# Patient Record
Sex: Female | Born: 1995 | Race: Black or African American | Hispanic: No | Marital: Single | Smoking: Never smoker
Health system: Southern US, Community
[De-identification: ages and names within clinical notes are randomized; demographics above are authoritative.]

## PROBLEM LIST (undated history)

## (undated) DIAGNOSIS — E538 Deficiency of other specified B group vitamins: Secondary | ICD-10-CM

## (undated) DIAGNOSIS — M419 Scoliosis, unspecified: Secondary | ICD-10-CM

## (undated) DIAGNOSIS — E559 Vitamin D deficiency, unspecified: Secondary | ICD-10-CM

## (undated) DIAGNOSIS — M545 Low back pain, unspecified: Secondary | ICD-10-CM

## (undated) HISTORY — DX: Deficiency of other specified B group vitamins: E53.8

## (undated) HISTORY — DX: Vitamin D deficiency, unspecified: E55.9

## (undated) HISTORY — DX: Scoliosis, unspecified: M41.9

## (undated) HISTORY — DX: Low back pain, unspecified: M54.50

## (undated) HISTORY — DX: Low back pain: M54.5

---
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
## 2016-04-01 HISTORY — PX: DILATION AND CURETTAGE, DIAGNOSTIC / THERAPEUTIC: SUR384

## 2016-06-13 ENCOUNTER — Encounter: Payer: Self-pay | Admitting: Emergency Medicine

## 2016-06-13 ENCOUNTER — Emergency Department
Admission: EM | Admit: 2016-06-13 | Discharge: 2016-06-14 | Disposition: A | Discharge status: Home / Self Care | Payer: Self-pay | Attending: Emergency Medicine | Admitting: Emergency Medicine

## 2016-06-13 DIAGNOSIS — L299 Pruritus, unspecified: Secondary | ICD-10-CM | POA: Insufficient documentation

## 2016-06-13 LAB — CBC
HEMATOCRIT: 39.4 % (ref 35.0–47.0)
HEMOGLOBIN: 13.3 g/dL (ref 12.0–16.0)
MCH: 27.9 pg (ref 26.0–34.0)
MCHC: 33.7 g/dL (ref 32.0–36.0)
MCV: 82.9 fL (ref 80.0–100.0)
Platelets: 374 10*3/uL (ref 150–440)
RBC: 4.75 MIL/uL (ref 3.80–5.20)
RDW: 14.3 % (ref 11.5–14.5)
WBC: 11.7 10*3/uL — ABNORMAL HIGH (ref 3.6–11.0)

## 2016-06-13 LAB — URINALYSIS, COMPLETE (UACMP) WITH MICROSCOPIC
BACTERIA UA: NONE SEEN
Bilirubin Urine: NEGATIVE
GLUCOSE, UA: NEGATIVE mg/dL
KETONES UR: NEGATIVE mg/dL
Leukocytes, UA: NEGATIVE
Nitrite: NEGATIVE
PROTEIN: NEGATIVE mg/dL
Specific Gravity, Urine: 1.03 (ref 1.005–1.030)
pH: 6 (ref 5.0–8.0)

## 2016-06-13 LAB — COMPREHENSIVE METABOLIC PANEL
ALK PHOS: 74 U/L (ref 38–126)
ALT: 34 U/L (ref 14–54)
ANION GAP: 6 (ref 5–15)
AST: 35 U/L (ref 15–41)
Albumin: 4.3 g/dL (ref 3.5–5.0)
BUN: 14 mg/dL (ref 6–20)
CALCIUM: 9.1 mg/dL (ref 8.9–10.3)
CO2: 25 mmol/L (ref 22–32)
Chloride: 107 mmol/L (ref 101–111)
Creatinine, Ser: 0.82 mg/dL (ref 0.44–1.00)
GFR calc non Af Amer: >60 mL/min (ref >60)
Glucose, Bld: 95 mg/dL (ref 65–99)
Potassium: 4.1 mmol/L (ref 3.5–5.1)
SODIUM: 138 mmol/L (ref 135–145)
TOTAL PROTEIN: 8.5 g/dL — AB (ref 6.5–8.1)
Total Bilirubin: 0.7 mg/dL (ref 0.3–1.2)

## 2016-06-13 LAB — LIPASE, BLOOD: Lipase: 23 U/L (ref 11–51)

## 2016-06-13 NOTE — ED Triage Notes (Signed)
Pt ambulatory to triage in NAD, report itching x 2 days, no rash seen, started on back and spread all over body.  Pt reports vomiting this morning and nausea since.  Pt c/o HA as well.

## 2016-06-13 NOTE — ED Notes (Addendum)
Pt reports itching x2 days, started on her face and turned "gray and green", before spreading to rest of body. No rash or discoloration noted to face or body at this time. Pt reports use of new "coconut soap" and OTC Prenatal vitamins (pt states unsure of actual pregnancy, "taking them just in case").

## 2016-06-14 LAB — POCT PREGNANCY, URINE
Preg Test, Ur: NEGATIVE
Preg Test, Ur: NEGATIVE

## 2016-06-14 MED ORDER — LORATADINE 10 MG PO TABS
10.0000 mg | ORAL_TABLET | Freq: Once | ORAL | Status: AC
  Administered 2016-06-14: 10 mg via ORAL
  Filled 2016-06-14: qty 1

## 2016-06-14 NOTE — ED Provider Notes (Signed)
[facility] Emergency Department Provider Note   First MD Initiated Contact with Patient 06/13/16 2350     (approximate)  I have reviewed the triage vital signs and the nursing notes.   HISTORY  Chief Complaint Pruritis; Emesis; and Headache   HPI Dominique Beck is a 21 y.o. female presents with generalized itching intermittently 2 days. Patient denies any rash area patient states pruritus started on her back and subsequently spread all over her body. Patient denies any dyspnea or difficulty swallowing. Patient states that she has not started any new education, did not eat any unusual foods, she does admit to starting a new Coke and an body scrub. Patient admits to nausea and states that she believes she may be pregnant. Patient states she's had  multiple negative pregnancy test at home. Last LMP February 2018   Past medical history No pertinent past medical history There are no active problems to display for this patient.   Past surgical history None  Prior to Admission medications   Not on File    Allergies No known drug allergies History reviewed. No pertinent family history.  Social History Social History  Substance Use Topics  . Smoking status: Never Smoker  . Smokeless tobacco: Never Used  . Alcohol use Yes    Review of Systems Constitutional: No fever/chills Eyes: No visual changes. ENT: No sore throat. Cardiovascular: Denies chest pain. Respiratory: Denies shortness of breath. Gastrointestinal: No abdominal pain.  No nausea, no vomiting.  No diarrhea.  No constipation. Genitourinary: Negative for dysuria. Musculoskeletal: Negative for back pain. Skin: Negative for rash.Positive for pruritus  Neurological: Negative for headaches, focal weakness or numbness.  10-point ROS otherwise negative.  ____________________________________________   PHYSICAL EXAM:  VITAL SIGNS: ED Triage Vitals [06/13/16 2241]  Enc Vitals Group   BP 130/79     Pulse Rate 90     Resp 18     Temp 99.3 F (37.4 C)     Temp Source Oral     SpO2 99 %     Weight 180 lb (81.6 kg)     Height 5\' 4"  (1.626 m)     Head Circumference      Peak Flow      Pain Score 0     Pain Loc      Pain Edu?      Excl. in GC?     Constitutional: Alert and oriented. Well appearing and in no acute distress. Eyes: Conjunctivae are normal. PERRL. EOMI. Head: Atraumatic. Mouth/Throat: Mucous membranes are moist.  Oropharynx non-erythematous. Neck: No stridor.  Cardiovascular: Normal rate, regular rhythm. Good peripheral circulation. Grossly normal heart sounds. Respiratory: Normal respiratory effort.  No retractions. Lungs CTAB. Gastrointestinal: Soft and nontender. No distention.  Musculoskeletal: No lower extremity tenderness nor edema. No gross deformities of extremities. Neurologic:  Normal speech and language. No gross focal neurologic deficits are appreciated.  Skin:  Skin is warm, dry and intact. No rash noted. Psychiatric: Mood and affect are normal. Speech and behavior are normal.  ____________________________________________   LABS (all labs ordered are listed, but only abnormal results are displayed)  Labs Reviewed  COMPREHENSIVE METABOLIC PANEL - Abnormal; Notable for the following:       Result Value   Total Protein 8.5 (*)    All other components within normal limits  CBC - Abnormal; Notable for the following:    WBC 11.7 (*)    All other components within normal limits  URINALYSIS, COMPLETE (UACMP) WITH  MICROSCOPIC - Abnormal; Notable for the following:    Color, Urine YELLOW (*)    APPearance CLEAR (*)    Hgb urine dipstick SMALL (*)    Squamous Epithelial / LPF 0-5 (*)    All other components within normal limits  LIPASE, BLOOD  POC URINE PREG, ED  POCT PREGNANCY, URINE     Procedures     INITIAL IMPRESSION / ASSESSMENT AND PLAN / ED COURSE  Pertinent labs & imaging results that were available during my care of  the patient were reviewed by me and considered in my medical decision making (see chart for detail   History and physical exam consistent with possible contact dermatitis. Patient will be referred to primary care provider for further outpatient evaluation.  ____________________________________________  FINAL CLINICAL IMPRESSION(S) / ED DIAGNOSES  Final diagnoses:  Pruritus     MEDICATIONS GIVEN DURING THIS VISIT:  Medications  loratadine (CLARITIN) tablet 10 mg (not administered)     NEW OUTPATIENT MEDICATIONS STARTED DURING THIS VISIT:  New Prescriptions   No medications on file    Modified Medications   No medications on file    Discontinued Medications   No medications on file     Note:  This document was prepared using Dragon voice recognition software and may include unintentional dictation errors.    Darci Current, MD 06/14/16 [phone]

## 2016-11-12 ENCOUNTER — Emergency Department (HOSPITAL_COMMUNITY)
Admission: EM | Admit: 2016-11-12 | Discharge: 2016-11-12 | Disposition: A | Discharge status: Home / Self Care | Payer: Self-pay | Attending: Emergency Medicine | Admitting: Emergency Medicine

## 2016-11-12 ENCOUNTER — Encounter (HOSPITAL_COMMUNITY): Payer: Self-pay | Admitting: Emergency Medicine

## 2016-11-12 DIAGNOSIS — Z872 Personal history of diseases of the skin and subcutaneous tissue: Secondary | ICD-10-CM | POA: Insufficient documentation

## 2016-11-12 DIAGNOSIS — M533 Sacrococcygeal disorders, not elsewhere classified: Secondary | ICD-10-CM | POA: Insufficient documentation

## 2016-11-12 MED ORDER — CEPHALEXIN 500 MG PO CAPS: 500.0000 mg | ORAL_CAPSULE | Freq: Four times a day (QID) | ORAL | 0 refills | Status: DC

## 2016-11-12 MED ORDER — ACETAMINOPHEN 500 MG PO TABS: 500.0000 mg | ORAL_TABLET | Freq: Four times a day (QID) | ORAL | 0 refills | Status: DC | PRN

## 2016-11-12 NOTE — ED Notes (Signed)
Patient states she is [redacted] weeks pregnant but have not seen an OB as of yet. She states she lost her insurance, however, she feels like her chronic cyst that was on her mid lower back is returning because she is having some pain there and constipation. Upon assessment no obvious skin interruptions noted. Patient also states she feels as though her vitamin B and D is low as she has not been able to get her Vit. D RX filled bc she lost her insurance and has been taking her mother's vitamin B. States we may need to check her levels and provide her with these RX until she can get RX by another provider. States PCP refused to refill and asked her to come in for a visit.

## 2016-11-12 NOTE — ED Triage Notes (Signed)
Pt reports having cyst on lower spine that has come back and now having pain in back and into legs. Pt reports being [redacted] weeks pregnant.

## 2016-11-12 NOTE — ED Provider Notes (Signed)
[facility]-EMERGENCY DEPT Provider Note   CSN: [id number] Arrival date & time: 11/12/16  0032   History   Chief Complaint Chief Complaint  Patient presents with  . Cyst  . Back Pain    HPI Dominique Beck is a 21 y.o. female.  21 year old female presents to the emergency department for evaluation of pain to her lower back and intergluteal cleft. She states that the pain is worse with certain movements and is aggravated with walking as well as sitting. She states the pain feels similar to when she was diagnosed with a pilonidal abscess. She had this drained, but never had the cyst pocket removed.She feels as though her symptoms are returning. She has noted constipation for which she has taken for laxatives without a bowel movement. She has not had any nausea, vomiting, fevers. No urinary symptoms. No medications taken for pain.   Patient reports that she is currently [redacted] weeks pregnant. She states that she has had an ultrasound confirming IUP   The history is provided by the patient. No language interpreter was used.  Back Pain      History reviewed. No pertinent past medical history.  There are no active problems to display for this patient.   Past Surgical History:  Procedure Laterality Date  . DILATION AND CURETTAGE, DIAGNOSTIC / THERAPEUTIC  04/2016    OB History    Gravida Para Term Preterm AB Living   1             SAB TAB Ectopic Multiple Live Births                   Home Medications    Prior to Admission medications   Medication Sig Start Date End Date Taking? Authorizing Provider  acetaminophen (TYLENOL) 500 MG tablet Take 1 tablet (500 mg total) by mouth every 6 (six) hours as needed. 11/12/16   Antony MaduraHumes, Alexis Reber, PA-C  cephALEXin (KEFLEX) 500 MG capsule Take 1 capsule (500 mg total) by mouth 4 (four) times daily. 11/12/16   Antony MaduraHumes, Jaala Bohle, PA-C    Family History History reviewed. No pertinent family history.  Social History Social History  Substance Use Topics  .  Smoking status: Never Smoker  . Smokeless tobacco: Never Used  . Alcohol use Yes     Allergies   Patient has no known allergies.   Review of Systems Review of Systems  Musculoskeletal: Positive for back pain.  Ten systems reviewed and are negative for acute change, except as noted in the HPI.    Physical Exam Updated Vital Signs BP 115/68 (BP Location: Left Arm)   Pulse 67   Temp 98.8 F (37.1 C) (Oral)   Resp 19   Ht 5\' 4"  (1.626 m)   Wt 90.7 kg (200 lb)   LMP 05/18/2016   SpO2 99%   BMI 34.33 kg/m   Physical Exam  Constitutional: She is oriented to person, place, and time. She appears well-developed and well-nourished. No distress.  Nontoxic appearing and in NAD  HENT:  Head: Normocephalic and atraumatic.  Eyes: Conjunctivae and EOM are normal. No scleral icterus.  Neck: Normal range of motion.  Cardiovascular: Normal rate, regular rhythm and intact distal pulses.   Pulmonary/Chest: Effort normal. No respiratory distress.  Respirations even and unlabored  Genitourinary:     Genitourinary Comments: Mild discomfort on palpation to the intergluteal cleft, favoring the right side. There is no distinct induration or erythema. No palpable masses. No perianal induration or tenderness.  Musculoskeletal: Normal range  of motion.  Neurological: She is alert and oriented to person, place, and time. She exhibits normal muscle tone. Coordination normal.  GCS 15. Patient moving all extremities.  Skin: Skin is warm and dry. No rash noted. She is not diaphoretic. No erythema. No pallor.  Psychiatric: She has a normal mood and affect. Her behavior is normal.  Nursing note and vitals reviewed.    ED Treatments / Results  Labs (all labs ordered are listed, but only abnormal results are displayed) Labs Reviewed - No data to display  EKG  EKG Interpretation None       Radiology No results found.  Procedures Procedures (including critical care time)  Medications  Ordered in ED Medications - No data to display   Initial Impression / Assessment and Plan / ED Course  I have reviewed the triage vital signs and the nursing notes.  Pertinent labs & imaging results that were available during my care of the patient were reviewed by me and considered in my medical decision making (see chart for details).     21 year old female presents to the emergency department for evaluation of lower back and intergluteal cleft discomfort. She states the symptoms feel similar to when she had a pilonidal abscess. There is no evidence of palpable cyst or abscess today, though pain is reproducible on palpation. No evidence of drainable abscess; however, history does lend itself to the possibility of a developing infection. Patient denies recent fall or trauma. Plan to start patient on Keflex as this is safe in pregnancy, in an effort to prevent worsening symptoms or abscess development. Tylenol recommended for pain. Return precautions discussed and provided. Patient discharged in stable condition with no unaddressed concerns.   Final Clinical Impressions(s) / ED Diagnoses   Final diagnoses:  Sacral pain  History of pilonidal cyst    New Prescriptions New Prescriptions   ACETAMINOPHEN (TYLENOL) 500 MG TABLET    Take 1 tablet (500 mg total) by mouth every 6 (six) hours as needed.   CEPHALEXIN (KEFLEX) 500 MG CAPSULE    Take 1 capsule (500 mg total) by mouth 4 (four) times daily.     Antony Madura, PA-C 11/12/16 0534    Dione Booze, MD 11/12/16 [phone]

## 2017-09-11 ENCOUNTER — Ambulatory Visit (INDEPENDENT_AMBULATORY_CARE_PROVIDER_SITE_OTHER): Payer: Self-pay | Admitting: Internal Medicine

## 2017-09-11 ENCOUNTER — Encounter: Payer: Self-pay | Admitting: Internal Medicine

## 2017-09-11 ENCOUNTER — Ambulatory Visit (INDEPENDENT_AMBULATORY_CARE_PROVIDER_SITE_OTHER): Payer: Self-pay

## 2017-09-11 VITALS — BP 110/70 | HR 85 | Temp 98.4°F | Ht 64.0 in | Wt 190.8 lb

## 2017-09-11 DIAGNOSIS — M545 Low back pain: Secondary | ICD-10-CM

## 2017-09-11 DIAGNOSIS — Z1389 Encounter for screening for other disorder: Secondary | ICD-10-CM

## 2017-09-11 DIAGNOSIS — Z1329 Encounter for screening for other suspected endocrine disorder: Secondary | ICD-10-CM

## 2017-09-11 DIAGNOSIS — E049 Nontoxic goiter, unspecified: Secondary | ICD-10-CM

## 2017-09-11 DIAGNOSIS — Z0000 Encounter for general adult medical examination without abnormal findings: Secondary | ICD-10-CM

## 2017-09-11 DIAGNOSIS — Z1159 Encounter for screening for other viral diseases: Secondary | ICD-10-CM

## 2017-09-11 DIAGNOSIS — G8929 Other chronic pain: Secondary | ICD-10-CM

## 2017-09-11 DIAGNOSIS — Z0184 Encounter for antibody response examination: Secondary | ICD-10-CM

## 2017-09-11 DIAGNOSIS — E559 Vitamin D deficiency, unspecified: Secondary | ICD-10-CM | POA: Insufficient documentation

## 2017-09-11 DIAGNOSIS — E538 Deficiency of other specified B group vitamins: Secondary | ICD-10-CM | POA: Insufficient documentation

## 2017-09-11 DIAGNOSIS — Z13220 Encounter for screening for lipoid disorders: Secondary | ICD-10-CM

## 2017-09-11 DIAGNOSIS — J302 Other seasonal allergic rhinitis: Secondary | ICD-10-CM | POA: Insufficient documentation

## 2017-09-11 LAB — COMPREHENSIVE METABOLIC PANEL
ALBUMIN: 4.1 g/dL (ref 3.5–5.2)
ALT: 13 U/L (ref 0–35)
AST: 25 U/L (ref 0–37)
Alkaline Phosphatase: 69 U/L (ref 39–117)
BILIRUBIN TOTAL: 0.4 mg/dL (ref 0.2–1.2)
BUN: 12 mg/dL (ref 6–23)
CALCIUM: 9.4 mg/dL (ref 8.4–10.5)
CO2: 18 mEq/L — ABNORMAL LOW (ref 19–32)
Chloride: 109 mEq/L (ref 96–112)
Creatinine, Ser: 0.84 mg/dL (ref 0.40–1.20)
GFR: 108.72 mL/min (ref >60.00)
Glucose, Bld: 76 mg/dL (ref 70–99)
Potassium: 4.6 mEq/L (ref 3.5–5.1)
Sodium: 139 mEq/L (ref 135–145)
Total Protein: 7.4 g/dL (ref 6.0–8.3)

## 2017-09-11 LAB — CBC WITH DIFFERENTIAL/PLATELET
BASOS ABS: 0.1 10*3/uL (ref 0.0–0.1)
Basophils Relative: 1.5 % (ref 0.0–3.0)
Eosinophils Absolute: 0.2 10*3/uL (ref 0.0–0.7)
Eosinophils Relative: 3.7 % (ref 0.0–5.0)
HEMATOCRIT: 38 % (ref 36.0–46.0)
HEMOGLOBIN: 12.4 g/dL (ref 12.0–15.0)
LYMPHS PCT: 48.8 % — AB (ref 12.0–46.0)
Lymphs Abs: 2.3 10*3/uL (ref 0.7–4.0)
MCHC: 32.6 g/dL (ref 30.0–36.0)
MCV: 84.8 fl (ref 78.0–100.0)
MONOS PCT: 8.3 % (ref 3.0–12.0)
Monocytes Absolute: 0.4 10*3/uL (ref 0.1–1.0)
NEUTROS ABS: 1.8 10*3/uL (ref 1.4–7.7)
Neutrophils Relative %: 37.7 % — ABNORMAL LOW (ref 43.0–77.0)
Platelets: 290 10*3/uL (ref 150.0–400.0)
RBC: 4.48 Mil/uL (ref 3.87–5.11)
RDW: 14.3 % (ref 11.5–15.5)
WBC: 4.7 10*3/uL (ref 4.0–10.5)

## 2017-09-11 LAB — LIPID PANEL
CHOL/HDL RATIO: 4
Cholesterol: 137 mg/dL (ref 0–200)
HDL: 38.6 mg/dL — ABNORMAL LOW (ref >39.00)
LDL CALC: 86 mg/dL (ref 0–99)
NONHDL: 98.52
TRIGLYCERIDES: 62 mg/dL (ref 0.0–149.0)
VLDL: 12.4 mg/dL (ref 0.0–40.0)

## 2017-09-11 LAB — VITAMIN B12: Vitamin B-12: 568 pg/mL (ref 211–911)

## 2017-09-11 LAB — POCT URINE PREGNANCY: Preg Test, Ur: NEGATIVE

## 2017-09-11 LAB — VITAMIN D 25 HYDROXY (VIT D DEFICIENCY, FRACTURES): VITD: 7.06 ng/mL — ABNORMAL LOW (ref 30.00–100.00)

## 2017-09-11 LAB — TSH: TSH: 1.84 u[IU]/mL (ref 0.35–4.50)

## 2017-09-11 LAB — T4, FREE: Free T4: 1.01 ng/dL (ref 0.60–1.60)

## 2017-09-11 NOTE — Progress Notes (Addendum)
Chief Complaint  Patient presents with  . New Patient (Initial Visit)   NP  1. C/o chronic low back pain since age 22 y.o h/o abscess drained from back years ago and resolved. Last year c/o 8/10 low back pain with radiation to legs b/l now no radiation but with chronic low back pain radiates up back about 2". 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 Noticed lump in throat worse on leg and sometimes on inside effects swallowing.   3. Seasonal allergies taking allegra not helping and will try zyrtec also has red/watery puffy eyes at times no drops tried.  4. H/o vitamin B12 and D def    Review of Systems  Constitutional: Negative for weight loss.  HENT: Negative for hearing loss.   Eyes: Negative for blurred vision and redness.  Respiratory: Negative for shortness of breath.   Cardiovascular: Negative for chest pain.  Gastrointestinal: Negative for abdominal pain.  Musculoskeletal: Positive for back pain.  Skin: Negative for rash.  Neurological: Negative for headaches.  Psychiatric/Behavioral: Negative for depression.   Past Medical History:  Diagnosis Date  . Low back pain   . Vitamin B12 deficiency   . Vitamin D deficiency    Past Surgical History:  Procedure Laterality Date  . DILATION AND CURETTAGE, DIAGNOSTIC / THERAPEUTIC  04/2016   Family History  Problem Relation Age of Onset  . Alcohol abuse Father   . Depression Father   . Drug abuse Father   . Learning disabilities Father   . Hyperlipidemia Maternal Grandmother   . Hypertension Paternal Grandmother   . Hypertension Paternal Grandfather   . Stroke Paternal Grandfather   . Heart disease Paternal Grandfather   . Diabetes Paternal Grandfather    Social History   Socioeconomic History  . Marital status: Single    Spouse name: Not on file  . Number of children:  Not on file  . Years of education: Not on file  . Highest education level: Not on file  Occupational History  . Not on file  Social Needs  . Financial resource strain: Not on file  . Food insecurity:    Worry: Not on file    Inability: Not on file  . Transportation needs:    Medical: Not on file    Non-medical: Not on file  Tobacco Use  . Smoking status: Never Smoker  . Smokeless tobacco: Never Used  Substance and Sexual Activity  . Alcohol use: Yes  . Drug use: Not Currently  . Sexual activity: Not on file  Lifestyle  . Physical activity:    Days per week: Not on file    Minutes per session: Not on file  . Stress: Not on file  Relationships  . Social connections:    Talks on phone: Not on file    Gets together: Not on file    Attends religious service: Not on file    Active member of club or organization: Not on file    Attends meetings of clubs or organizations: Not on file    Relationship status: Not on file  . Intimate partner violence:    Fear of current or ex partner: Not on file    Emotionally abused: Not on file    Physically abused: Not on file    Forced sexual activity: Not on file  Other Topics Concern  .  Not on file  Social History Narrative   1 year college    [employer] Team Member    1 brother    No guns, wears seat belt, safe in relationship    No outpatient medications have been marked as taking for the 09/11/17 encounter (Office Visit) with McLean-Scocuzza, Nino Glow, MD.   No Known Allergies No results found for this or any previous visit (from the past 2160 hour(s)). Objective  Body mass index is 32.75 kg/m. Wt Readings from Last 3 Encounters:  09/11/17 190 lb 12.8 oz (86.5 kg)  11/12/16 200 lb (90.7 kg)  06/13/16 180 lb (81.6 kg)   Temp Readings from Last 3 Encounters:  09/11/17 98.4 F (36.9 C) (Oral)  11/12/16 98.4 F (36.9 C) (Oral)  06/13/16 99.3 F (37.4 C) (Oral)   BP Readings from Last 3 Encounters:  09/11/17 110/70   11/12/16 113/73  06/13/16 121/75   Pulse Readings from Last 3 Encounters:  09/11/17 85  11/12/16 88  06/13/16 86    Physical Exam  Constitutional: She is oriented to person, place, and time. Vital signs are normal. She appears well-developed and well-nourished. She is cooperative.  HENT:  Head: Normocephalic and atraumatic.  Mouth/Throat: Oropharynx is clear and moist and mucous membranes are normal.  Eyes: Pupils are equal, round, and reactive to light. Conjunctivae are normal.  Cardiovascular: Normal rate, regular rhythm and normal heart sounds.  Pulmonary/Chest: Effort normal and breath sounds normal.  Musculoskeletal: She exhibits tenderness.       Lumbar back: She exhibits tenderness.  +str8 leg test b/l   Neurological: She is alert and oriented to person, place, and time. Gait normal.  Skin: Skin is warm, dry and intact.  Psychiatric: She has a normal mood and affect. Her speech is normal and behavior is normal. Judgment and thought content normal. Cognition and memory are normal.  Nursing note and vitals reviewed. ? Goiter neck   Assessment   1. Chronic low back pain resolved sciatica  2. ? Goiter neck  3. Seasonal allergies with eye sx's  4. Vit D and B12 def  5. HM Plan  1. Xray low back today  Trial of stretches prev OTC meds not worked  2. Thyroid US  Labs today  3. Trial of zyrtec and otc eye drops  4. Check labs today  5.  Declines flu shot  Tdap had 12/13/08 or 05/25/08  Declines STD check  Get pap [place] in past ? If had pap Dr. 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 Myrene Buddy HIV neg 04/02/16   Labs today   Reviewed vaccines  Had HPV vaccines, hep B vaccine, hib vaccine, MMR, 1/2 menigo on 01/13/09, polio vaccines x 4, Td booster 12/13/08   Reviewed [facility] records pt there sicne 07/01/05 :  Low back pain  has been issue since 08/02/13 with h/o pilonidal cysts I&D 08/03/12  She was on Loestrin Fe OCP PPD test negative 10/28/13  ANA negative 02/23/14  Urine preg + 04/02/16 d&C abortion 04/13/16  HIV neg 04/02/16  Vitamin D 6.4 04/02/16  Vitamin B12 220 04/02/16  H/o yeast and BV RPR neg 04/02/16   Provider: Dr. Olivia Mackie McLean-Scocuzza-Internal Medicine

## 2017-09-11 NOTE — Addendum Note (Signed)
Addended by: Elise BenneBOOTH, Lark Langenfeld T on: 09/11/2017 10:36 AM   Modules accepted: Orders

## 2017-09-11 NOTE — Patient Instructions (Addendum)
F/u in 6 weeks   Vitamin D Deficiency Vitamin D deficiency is when your body does not have enough vitamin D. Vitamin D is important to your body for many reasons:  It helps the body to absorb two important minerals, called calcium and phosphorus.  It plays a role in bone health.  It may help to prevent some diseases, such as diabetes and multiple sclerosis.  It plays a role in muscle function, including heart function.  You can get vitamin D by:  Eating foods that naturally contain vitamin D.  Eating or drinking milk or other dairy products that have vitamin D added to them.  Taking a vitamin D supplement or a multivitamin supplement that contains vitamin D.  Being in the sun. Your body naturally makes vitamin D when your skin is exposed to sunlight. Your body changes the sunlight into a form of the vitamin that the body can use.  If vitamin D deficiency is severe, it can cause a condition in which your bones become soft. In adults, this condition is called osteomalacia. In children, this condition is called rickets. What are the causes? Vitamin D deficiency may be caused by:  Not eating enough foods that contain vitamin D.  Not getting enough sun exposure.  Having certain digestive system diseases that make it difficult for your body to absorb vitamin D. These diseases include Crohn disease, chronic pancreatitis, and cystic fibrosis.  Having a surgery in which a part of the stomach or a part of the small intestine is removed.  Being obese.  Having chronic kidney disease or liver disease.  What increases the risk? This condition is more likely to develop in:  Older people.  People who do not spend much time outdoors.  People who live in a long-term care facility.  People who have had broken bones.  People with weak or thin bones (osteoporosis).  People who have a disease or condition that changes how the body absorbs vitamin D.  People who have dark  skin.  People who take certain medicines, such as steroid medicines or certain seizure medicines.  People who are overweight or obese.  What are the signs or symptoms? In mild cases of vitamin D deficiency, there may not be any symptoms. If the condition is severe, symptoms may include:  Bone pain.  Muscle pain.  Falling often.  Broken bones caused by a minor injury.  How is this diagnosed? This condition is usually diagnosed with a blood test. How is this treated? Treatment for this condition may depend on what caused the condition. Treatment options include:  Taking vitamin D supplements.  Taking a calcium supplement. Your health care provider will suggest what dose is best for you.  Follow these instructions at home:  Take medicines and supplements only as told by your health care provider.  Eat foods that contain vitamin D. Choices include: ? Fortified dairy products, cereals, or juices. Fortified means that vitamin D has been added to the food. Check the label on the package to be sure. ? Fatty fish, such as salmon or trout. ? Eggs. ? Oysters.  Do not use a tanning bed.  Maintain a healthy weight. Lose weight, if needed.  Keep all follow-up visits as told by your health care provider. This is important. Contact a health care provider if:  Your symptoms do not go away.  You feel like throwing up (nausea) or you throw up (vomit).  You have fewer bowel movements than usual or it is  difficult for you to have a bowel movement (constipation). This information is not intended to replace advice given to you by your health care provider. Make sure you discuss any questions you have with your health care provider. Document Released: 06/10/2011 Document Revised: 08/30/2015 Document Reviewed: 08/03/2014 Elsevier Interactive Patient Education  2018 [employer].  Back Pain, Adult Many adults have back pain from time to time. Common causes of back pain include:  A  strained muscle or ligament.  Wear and tear (degeneration) of the spinal disks.  Arthritis.  A hit to the back.  Back pain can be short-lived (acute) or last a long time (chronic). A physical exam, lab tests, and imaging studies may be done to find the cause of your pain. Follow these instructions at home: Managing pain and stiffness  Take over-the-counter and prescription medicines only as told by your health care provider.  If directed, apply heat to the affected area as often as told by your health care provider. Use the heat source that your health care provider recommends, such as a moist heat pack or a heating pad. ? Place a towel between your skin and the heat source. ? Leave the heat on for 20-30 minutes. ? Remove the heat if your skin turns bright red. This is especially important if you are unable to feel pain, heat, or cold. You have a greater risk of getting burned.  If directed, apply ice to the injured area: ? Put ice in a plastic bag. ? Place a towel between your skin and the bag. ? Leave the ice on for 20 minutes, 2-3 times a day for the first 2-3 days. Activity  Do not stay in bed. Resting more than 1-2 days can delay your recovery.  Take short walks on even surfaces as soon as you are able. Try to increase the length of time you walk each day.  Do not sit, drive, or stand in one place for more than 30 minutes at a time. Sitting or standing for long periods of time can put stress on your back.  Use proper lifting techniques. When you bend and lift, use positions that put less stress on your back: ? [place] your knees. ? Keep the load close to your body. ? Avoid twisting.  Exercise regularly as told by your health care provider. Exercising will help your back heal faster. This also helps prevent back injuries by keeping muscles strong and flexible.  Your health care provider may recommend that you see a physical therapist. This person can help you come up with a  safe exercise program. Do any exercises as told by your physical therapist. Lifestyle  Maintain a healthy weight. Extra weight puts stress on your back and makes it difficult to have good posture.  Avoid activities or situations that make you feel anxious or stressed. Learn ways to manage anxiety and stress. One way to manage stress is through exercise. Stress and anxiety increase muscle tension and can make back pain worse. General instructions  Sleep on a firm mattress in a comfortable position. Try lying on your side with your knees slightly bent. If you lie on your back, put a pillow under your knees.  Follow your treatment plan as told by your health care provider. This may include: ? Cognitive or behavioral therapy. ? Acupuncture or massage therapy. ? Meditation or yoga. Contact a health care provider if:  You have pain that is not relieved with rest or medicine.  You have increasing pain  going down into your legs or buttocks.  Your pain does not improve in 2 weeks.  You have pain at night.  You lose weight.  You have a fever or chills. Get help right away if:  You develop new bowel or bladder control problems.  You have unusual weakness or numbness in your arms or legs.  You develop nausea or vomiting.  You develop abdominal pain.  You feel faint. Summary  Many adults have back pain from time to time. A physical exam, lab tests, and imaging studies may be done to find the cause of your pain.  Use proper lifting techniques. When you bend and lift, use positions that put less stress on your back.  Take over-the-counter and prescription medicines and apply heat or ice as directed by your health care provider. This information is not intended to replace advice given to you by your health care provider. Make sure you discuss any questions you have with your health care provider. Document Released: 03/18/2005 Document Revised: 04/22/2016 Document Reviewed:  04/22/2016 Elsevier Interactive Patient Education  [employer].

## 2017-09-11 NOTE — Progress Notes (Signed)
Pre visit review using our clinic review tool, if applicable. No additional management support is needed unless otherwise documented below in the visit note. 

## 2017-09-12 ENCOUNTER — Other Ambulatory Visit: Payer: Self-pay | Admitting: Internal Medicine

## 2017-09-12 DIAGNOSIS — E559 Vitamin D deficiency, unspecified: Secondary | ICD-10-CM

## 2017-09-12 LAB — MEASLES/MUMPS/RUBELLA IMMUNITY
MUMPS IGG: 57.7 [AU]/ml
Rubella: 18.7 index

## 2017-09-12 LAB — URINALYSIS, ROUTINE W REFLEX MICROSCOPIC
BILIRUBIN UA: NEGATIVE
Glucose, UA: NEGATIVE
KETONES UA: NEGATIVE
LEUKOCYTES UA: NEGATIVE
Nitrite, UA: NEGATIVE
PH UA: 7 (ref 5.0–7.5)
Protein, UA: NEGATIVE
RBC UA: NEGATIVE
SPEC GRAV UA: 1.021 (ref 1.005–1.030)
Urobilinogen, Ur: 0.2 mg/dL (ref 0.2–1.0)

## 2017-09-12 LAB — HEPATITIS B SURFACE ANTIBODY, QUANTITATIVE: Hepatitis B-Post: 41 m[IU]/mL (ref >=10)

## 2017-09-12 MED ORDER — CHOLECALCIFEROL 1.25 MG (50000 UT) PO CAPS
50000.0000 [IU] | ORAL_CAPSULE | ORAL | 1 refills | Status: DC
Start: 2017-09-12 — End: 2018-02-11

## 2017-09-17 ENCOUNTER — Ambulatory Visit: Payer: 59

## 2017-09-18 ENCOUNTER — Other Ambulatory Visit: Payer: Self-pay | Admitting: Internal Medicine

## 2017-09-18 DIAGNOSIS — G8929 Other chronic pain: Secondary | ICD-10-CM

## 2017-09-18 DIAGNOSIS — M545 Low back pain: Principal | ICD-10-CM

## 2017-09-25 ENCOUNTER — Telehealth: Payer: Self-pay | Admitting: Internal Medicine

## 2017-09-25 NOTE — Telephone Encounter (Signed)
[facility]

## 2017-09-30 ENCOUNTER — Telehealth: Payer: Self-pay

## 2017-09-30 ENCOUNTER — Ambulatory Visit: Payer: 59

## 2017-09-30 NOTE — Telephone Encounter (Signed)
Copied from CRM [phone]. Topic: Appointment Scheduling - Scheduling Inquiry for Clinic >> Sep 30, 2017  9:38 AM Cipriano BunkerLambe, Annette S wrote: Reason for CRM: Sel[facility]attenKim [facility] called to let dr. Ashley MarinerKnow she did not show up for her ultra sound

## 2017-10-10 DIAGNOSIS — H52223 Regular astigmatism, bilateral: Secondary | ICD-10-CM | POA: Diagnosis not present

## 2017-10-10 DIAGNOSIS — H5213 Myopia, bilateral: Secondary | ICD-10-CM | POA: Diagnosis not present

## 2017-10-23 ENCOUNTER — Ambulatory Visit: Payer: Self-pay | Admitting: Internal Medicine

## 2017-10-24 ENCOUNTER — Ambulatory Visit: Payer: Self-pay | Admitting: Internal Medicine

## 2017-10-31 ENCOUNTER — Ambulatory Visit: Payer: Self-pay | Admitting: Internal Medicine

## 2018-01-07 ENCOUNTER — Ambulatory Visit: Admitting: Internal Medicine

## 2018-01-19 ENCOUNTER — Other Ambulatory Visit: Payer: Self-pay | Admitting: Internal Medicine

## 2018-01-19 DIAGNOSIS — E049 Nontoxic goiter, unspecified: Secondary | ICD-10-CM

## 2018-01-29 ENCOUNTER — Ambulatory Visit (HOSPITAL_COMMUNITY)
Admission: RE | Admit: 2018-01-29 | Discharge: 2018-01-29 | Disposition: A | Discharge status: Home / Self Care | Payer: 59 | Source: Ambulatory Visit | Attending: Internal Medicine | Admitting: Internal Medicine

## 2018-01-29 DIAGNOSIS — E049 Nontoxic goiter, unspecified: Secondary | ICD-10-CM | POA: Insufficient documentation

## 2018-01-29 DIAGNOSIS — Z0389 Encounter for observation for other suspected diseases and conditions ruled out: Secondary | ICD-10-CM | POA: Diagnosis not present

## 2018-02-04 ENCOUNTER — Ambulatory Visit: Admitting: Internal Medicine

## 2018-02-11 ENCOUNTER — Encounter: Payer: Self-pay | Admitting: Internal Medicine

## 2018-02-11 ENCOUNTER — Ambulatory Visit (INDEPENDENT_AMBULATORY_CARE_PROVIDER_SITE_OTHER): Admitting: Internal Medicine

## 2018-02-11 VITALS — BP 118/68 | HR 68 | Temp 98.9°F | Ht 64.0 in | Wt 193.7 lb

## 2018-02-11 DIAGNOSIS — L309 Dermatitis, unspecified: Secondary | ICD-10-CM | POA: Diagnosis not present

## 2018-02-11 DIAGNOSIS — G8929 Other chronic pain: Secondary | ICD-10-CM | POA: Diagnosis not present

## 2018-02-11 DIAGNOSIS — M545 Low back pain: Secondary | ICD-10-CM | POA: Diagnosis not present

## 2018-02-11 DIAGNOSIS — E559 Vitamin D deficiency, unspecified: Secondary | ICD-10-CM

## 2018-02-11 DIAGNOSIS — L304 Erythema intertrigo: Secondary | ICD-10-CM

## 2018-02-11 DIAGNOSIS — N62 Hypertrophy of breast: Secondary | ICD-10-CM | POA: Diagnosis not present

## 2018-02-11 MED ORDER — HYDROCORTISONE 2.5 % EX CREA: TOPICAL_CREAM | Freq: Two times a day (BID) | CUTANEOUS | 0 refills | Status: AC

## 2018-02-11 MED ORDER — CLOTRIMAZOLE 1 % EX CREA: 1.0000 "application " | TOPICAL_CREAM | Freq: Two times a day (BID) | CUTANEOUS | 0 refills | Status: AC

## 2018-02-11 MED ORDER — CHOLECALCIFEROL 1.25 MG (50000 UT) PO CAPS: 50000.0000 [IU] | ORAL_CAPSULE | ORAL | 1 refills | Status: DC

## 2018-02-11 NOTE — Progress Notes (Addendum)
Chief Complaint  Patient presents with  . Follow-up   F/u  1. Reviewed labs vitamin D low rec take D3 50K weekly, thyroid labs normal  2. Chronic low back pain Xray + thoracolumbar mild scoliosis unable to due crunches due to back pain. She wonders if large breasts could be causing back pain low back as well and works at a gym and wears at sports bra 3. Mid chest rash new x 1 month w/o sx's I.e itching. She uses [employer] and does not sweat  4. Thyroid US normal neg goiter reviewed with pt today  5. Large breasts wants consultation for breast reduction as insurance covers is breast enlarged overtime she has had 2 pregnancies but no living children.    Review of Systems  Constitutional: Negative for weight loss.  HENT: Negative for hearing loss.   Eyes: Negative for blurred vision.  Respiratory: Negative for shortness of breath.   Cardiovascular: Negative for chest pain.  Gastrointestinal: Negative for abdominal pain.  Musculoskeletal: Positive for back pain.  Skin: Positive for rash. Negative for itching.  Neurological: Negative for headaches.  Psychiatric/Behavioral: Negative for depression.   Past Medical History:  Diagnosis Date  . Low back pain   . Vitamin B12 deficiency   . Vitamin D deficiency    Past Surgical History:  Procedure Laterality Date  . DILATION AND CURETTAGE, DIAGNOSTIC / THERAPEUTIC  04/2016   Family History  Problem Relation Age of Onset  . Alcohol abuse Father   . Depression Father   . Drug abuse Father   . Learning disabilities Father   . Hyperlipidemia Maternal Grandmother   . Hypertension Paternal Grandmother   . Hypertension Paternal Grandfather   . Stroke Paternal Grandfather   . Heart disease Paternal Grandfather   . Diabetes Paternal Grandfather    Social History   Socioeconomic History  . Marital status: Single    Spouse name: Not on file  . Number of children: Not on file  . Years of education: Not on file  . Highest  education level: Not on file  Occupational History  . Not on file  Social Needs  . Financial resource strain: Not on file  . Food insecurity:    Worry: Not on file    Inability: Not on file  . Transportation needs:    Medical: Not on file    Non-medical: Not on file  Tobacco Use  . Smoking status: Never Smoker  . Smokeless tobacco: Never Used  Substance and Sexual Activity  . Alcohol use: Yes  . Drug use: Not Currently  . Sexual activity: Not on file  Lifestyle  . Physical activity:    Days per week: Not on file    Minutes per session: Not on file  . Stress: Not on file  Relationships  . Social connections:    Talks on phone: Not on file    Gets together: Not on file    Attends religious service: Not on file    Active member of club or organization: Not on file    Attends meetings of clubs or organizations: Not on file    Relationship status: Not on file  . Intimate partner violence:    Fear of current or ex partner: Not on file    Emotionally abused: Not on file    Physically abused: Not on file    Forced sexual activity: Not on file  Other Topics Concern  . Not on file  Social History Narrative  1 year college    [employer] Member    1 brother    No guns, wears seat belt, safe in relationship    No outpatient medications have been marked as taking for the 02/11/18 encounter (Office Visit) with McLean-Scocuzza, Nino Glow, MD.   No Known Allergies No results found for this or any previous visit (from the past 2160 hour(s)). Objective  Body mass index is 33.25 kg/m. Wt Readings from Last 3 Encounters:  02/11/18 193 lb 11.2 oz (87.9 kg)  09/11/17 190 lb 12.8 oz (86.5 kg)  11/12/16 200 lb (90.7 kg)   Temp Readings from Last 3 Encounters:  02/11/18 98.9 F (37.2 C) (Oral)  09/11/17 98.4 F (36.9 C) (Oral)  11/12/16 98.4 F (36.9 C) (Oral)   BP Readings from Last 3 Encounters:  02/11/18 118/68  09/11/17 110/70  11/12/16 113/73   Pulse  Readings from Last 3 Encounters:  02/11/18 68  09/11/17 85  11/12/16 88    Physical Exam  Constitutional: She is oriented to person, place, and time. Vital signs are normal. She appears well-developed and well-nourished. She is cooperative.  HENT:  Head: Normocephalic and atraumatic.  Mouth/Throat: Oropharynx is clear and moist and mucous membranes are normal.  Eyes: Pupils are equal, round, and reactive to light. Conjunctivae are normal.  Cardiovascular: Normal rate, regular rhythm and normal heart sounds.  Pulmonary/Chest: Effort normal and breath sounds normal.  Neurological: She is alert and oriented to person, place, and time. Gait normal.  Skin: Skin is warm, dry and intact.     Eczematous vs fungal rash to mid chest with small papules merging together and hyperpigmentation mid chest and some scale    Psychiatric: She has a normal mood and affect. Her speech is normal and behavior is normal. Judgment and thought content normal. Cognition and memory are normal.  Nursing note and vitals reviewed.   Assessment   1. Vitamin D def  2. Chronic low back pain with mild thracolumbar scoliosis  3. Intertrigo vs eczema mid chest  4. Large breasts  5. HM Plan   1. Refilled D3 weekly encouraged to take  2. Disc back stretches If getting worse rec MRI low back  3. Clotrimazole use with hc 1% 1st then if not helping hc 2.5 % Call back in 2 weeks  4. Referred to Dr. Theodoro Kos Dillingham for consult to to breast reduction in [place]  5.  Declines flu shot  Tdap had 12/13/08 or 05/25/08  Declines STD check  -pap had 04/02/16 negative + yeast; HPV not tested repeat in 3 years 04/03/2019  MMR immune   H/o 2 pregnancies no live births  Eye exam due h/o astigmatism right eye and wears glasses lens [employer]s  Due to see Dentist Dr. Myrene Buddy HIV neg 04/02/16  Declines to be on OCP or birth control for now as of 02/11/18  Reviewed vaccines  Had HPV vaccines, hep B vaccine, hib vaccine,  MMR, 1/2 menigo on 01/13/09, polio vaccines x 4, Td booster 12/13/08    Provider: Dr. Olivia Mackie McLean-Scocuzza-[facility]

## 2018-02-11 NOTE — Patient Instructions (Addendum)
Dr. Wayland Denislaire Sanger [facility] [phone] [address] [place]  Call me back in 2 weeks if rash not going away try hydrocortisone 1% OTC if not working use stronger HC   Intertrigo Intertrigo is skin irritation or inflammation (dermatitis) that occurs when folds of skin rub together. The irritation can cause a rash and make skin raw and itchy. This condition most commonly occurs in the skin folds of these areas:  Toes.  Armpits.  Groin.  Belly.  Breasts.  Buttocks.  Intertrigo is not passed from person to person (is not contagious). What are the causes? This condition is caused by heat, moisture, friction, and lack of air circulation. The condition can be made worse by:  Sweat.  Bacteria or a fungus, such as yeast.  What increases the risk? This condition is more likely to occur if you have moisture in your skin folds. It is also more likely to develop in people who:  Have diabetes.  Are overweight.  Are on bed rest.  Live in a warm and moist climate.  Wear splints, braces, or other medical devices.  Are not able to control their bowels or bladder (have incontinence).  What are the signs or symptoms? Symptoms of this condition include:  A pink or red skin rash.  Brown patches on the skin.  Raw or scaly skin.  Itchiness.  A burning feeling.  Bleeding.  Leaking fluid.  A bad smell.  How is this diagnosed? This condition is diagnosed with a medical history and physical exam. You may also have a skin swab to test for bacteria or a fungus, such as yeast. How is this treated? Treatment may include:  Cleaning and drying your skin.  An oral antibiotic medicine or antibiotic skin cream for a bacterial infection.  Antifungal cream or pills for an infection that was caused by a fungus, such as yeast.  Steroid ointment to relieve itchiness and irritation.  Follow these instructions at home:  Keep the affected area  clean and dry.  Do not scratch your skin.  Stay in a cool environment as much as possible. Use an air conditioner or fan, if available.  Apply over-the-counter and prescription medicines only as told by your health care provider.  If you were prescribed an antibiotic medicine, use it as told by your health care provider. Do not stop using the antibiotic even if your condition improves.  Keep all follow-up visits as told by your health care provider. This is important. How is this prevented?  Maintain a healthy weight.  Take care of your feet, especially if you have diabetes. Foot care includes: ? Wearing shoes that fit well. ? Keeping your feet dry. ? Wearing clean, breathable socks.  Protect the skin around your groin and buttocks, especially if you have incontinence. Skin protection includes: ? Following a regular cleaning routine. ? Using moisturizers and skin protectants. ? Changing protection pads frequently.  Do not wear tight clothes. Wear clothes that are loose and absorbent. Wear clothes that are made of cotton.  Wear a bra that gives good support, if needed.  Shower and dry yourself thoroughly after activity. Use a hair dryer on a cool setting to dry between skin folds, especially after you bathe.  If you have diabetes, keep your blood sugar under control. Contact a health care provider if:  Your symptoms do not improve with treatment.  Your symptoms get worse or they spread.  You notice increased redness and warmth.  You have a fever. This information is not intended to replace advice given to you by your health care provider. Make sure you discuss any questions you have with your health care provider. Document Released: 03/18/2005 Document Revised: 08/24/2015 Document Reviewed: 09/19/2014 Elsevier Interactive Patient Education  2018 [employer].    Scoliosis Scoliosis is the name given to a spine that curves sideways.Scoliosis can cause twisting of your  shoulders, hips, chest, back, and rib cage. What are the causes? The cause of scoliosis is not always known. It may be caused by a birth defect or by a disease that can cause muscular dysfunction and imbalance, such as cerebral palsy and muscular dystrophy. What increases the risk? Having a disease that causes muscle disease or dysfunction. What are the signs or symptoms? Scoliosis often has no signs or symptoms.If they are present, they may include:  Unequal size of one body side compared to the other (asymmetry).  Visible curvature of the spine.  Pain. The pain may limit physical activity.  Shortness of breath.  Bowel or bladder issues.  How is this diagnosed? A skilled health care provider will perform an evaluation. This will involve:  Taking your history.  Performing a physical examination.  Performing a neurological exam to detect nerve or muscle function loss.  Range of motion studies on the spine.  X-rays.  An MRI may also be obtained. How is this treated? Treatment varies depending on the nature, extent, and severity of the disease. If the curvature is not great, you may need only observation. A brace may be used to prevent scoliosis from progressing. A brace may also be needed during growth spurts. Physical therapy may be of benefit. Surgery may be required. Follow these instructions at home:  Your health care provider may suggest exercises to strengthen your muscles. Perform them as directed.  Ask your health care provider before participating in any sports.  If you have been prescribed an orthopedic brace, wear it as instructed by your health care provider. Contact a health care provider if: Your brace causes the skin to become sore (chafe) or is uncomfortable. Get help right away if:  You have back pain that is not relieved by the medicines prescribed by your health care provider.  Your legs feel weak or you lose function in your legs.  You lose some  bowel or bladder control. This information is not intended to replace advice given to you by your health care provider. Make sure you discuss any questions you have with your health care provider. Document Released: 03/15/2000 Document Revised: 08/24/2015 Document Reviewed: 09/21/2015 Elsevier Interactive Patient Education  [employer].

## 2018-02-11 NOTE — Progress Notes (Signed)
Pre visit review using our clinic review tool, if applicable. No additional management support is needed unless otherwise documented below in the visit note. 

## 2018-03-03 ENCOUNTER — Institutional Professional Consult (permissible substitution): Admitting: Plastic Surgery

## 2018-03-06 ENCOUNTER — Encounter: Payer: Self-pay | Admitting: Plastic Surgery

## 2018-03-06 ENCOUNTER — Ambulatory Visit (INDEPENDENT_AMBULATORY_CARE_PROVIDER_SITE_OTHER): Admitting: Plastic Surgery

## 2018-03-06 VITALS — BP 135/82 | HR 69 | Resp 14 | Ht 63.0 in | Wt 190.0 lb

## 2018-03-06 DIAGNOSIS — M545 Low back pain: Secondary | ICD-10-CM | POA: Diagnosis not present

## 2018-03-06 DIAGNOSIS — M542 Cervicalgia: Secondary | ICD-10-CM | POA: Insufficient documentation

## 2018-03-06 DIAGNOSIS — G8929 Other chronic pain: Secondary | ICD-10-CM

## 2018-03-06 DIAGNOSIS — N62 Hypertrophy of breast: Secondary | ICD-10-CM | POA: Insufficient documentation

## 2018-03-06 NOTE — Progress Notes (Signed)
Patient ID: Dominique Beck, female    DOB: 03-14-1996, 22 y.o.   MRN: [id number]   Chief Complaint  Patient presents with  . Breast Problem    Mammary Hyperplasia: The patient is a 22 y.o. female with a history of mammary hyperplasia for several years.  She has extremely large breasts causing symptoms that include the following:.  Back pain (upper and lower) and neck pain. She frequently pins bra cups higher on straps for better lift and relief. Notices relief when holding breast up in her hands. Shoulder straps causing grooves, pain occasionally requiring padding. Pain medication is sometimes required with motrin and tylenol.  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  Her breasts are extremely large and fairly symmetric.  She has hyperpigmentation of the inframammary area on both sides.  The sternal to nipple distance on the right is 34 cm and the left is 34 cm.  The IMF distance is 12 cm.  She is 5 feet 4 inches tall and weighs 190 pounds.  Preoperative bra size = 36DD cup bra but is more likely DDD.  She would like to be a C cup the estimated excess breast tissue to be removed at the time of surgery = 500 grams on the left and 500 grams on the right.  Mammogram history: The past year and was negative.  She has had chronic low back pain and physical therapy has been recommended.  I encouraged her to follow through with this.  She has been told she has scoliosis.   Review of Systems  Constitutional: Positive for activity change. Negative for appetite change.  HENT: Negative.   Eyes: Negative.   Respiratory: Negative.   Cardiovascular: Negative.   Gastrointestinal: Negative.   Endocrine: Negative.   Genitourinary: Negative.   Musculoskeletal: Positive for back pain and neck pain.  Skin: Negative for color change, pallor, rash and wound.  Neurological: Negative.     Past Medical History:    Diagnosis Date  . Low back pain   . Scoliosis   . Vitamin B12 deficiency   . Vitamin D deficiency     Past Surgical History:  Procedure Laterality Date  . DILATION AND CURETTAGE, DIAGNOSTIC / THERAPEUTIC  04/2016      Current Outpatient Medications:  .  Cholecalciferol 1.25 MG (50000 UT) capsule, Take 1 capsule (50,000 Units total) by mouth once a week., Disp: 13 capsule, Rfl: 1 .  clotrimazole (LOTRIMIN) 1 % cream, Apply 1 application topically 2 (two) times daily., Disp: 60 g, Rfl: 0 .  hydrocortisone 2.5 % cream, Apply topically 2 (two) times daily., Disp: 60 g, Rfl: 0   Objective:   Vitals:   03/06/18 1509  BP: 135/82  Pulse: 69  Resp: 14  SpO2: 98%    Physical Exam  Constitutional: She is oriented to person, place, and time. She appears well-nourished.  HENT:  Head: Normocephalic and atraumatic.  Eyes: Pupils are equal, round, and reactive to light. EOM are normal.  Cardiovascular: Normal rate.  Pulmonary/Chest: Effort normal.  Abdominal: Soft. She exhibits no distension. There is no tenderness.  Neurological: She is alert and oriented to person, place, and time.  Skin: Skin is warm.  Psychiatric: She has a normal mood and affect. Her behavior is normal. Judgment and thought content normal.    Assessment & Plan:  Chronic midline low back pain without sciatica  Symptomatic mammary hypertrophy  Neck  pain  Recommend bilateral breast reduction.  Will likely be able to do a superior medial pedicle technique.  We discussed the scars with this and the postoperative course.  The notes from her primary care physician are in the computer and were reviewed. Alena Billslaire S Dillingham, DO

## 2019-09-29 IMAGING — DX DG LUMBAR SPINE COMPLETE 4+V
5 series · 5 of 5 positions shown · non-contrast
Comparison: None.

CLINICAL DATA: Worsening chronic low back pain.

EXAM:
LUMBAR SPINE - COMPLETE 4+ VIEW

[lumbar spine ap]
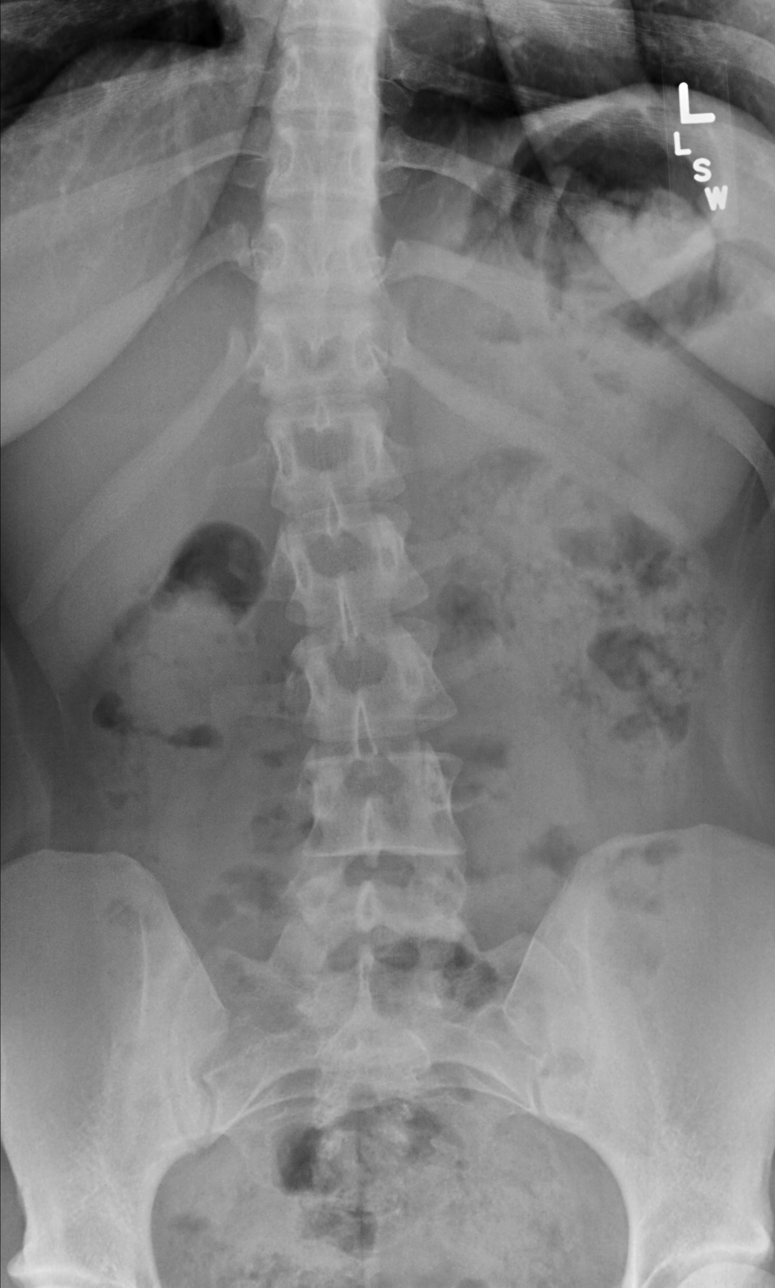

[lumbar spine obl (oblique) (1 of 2)]
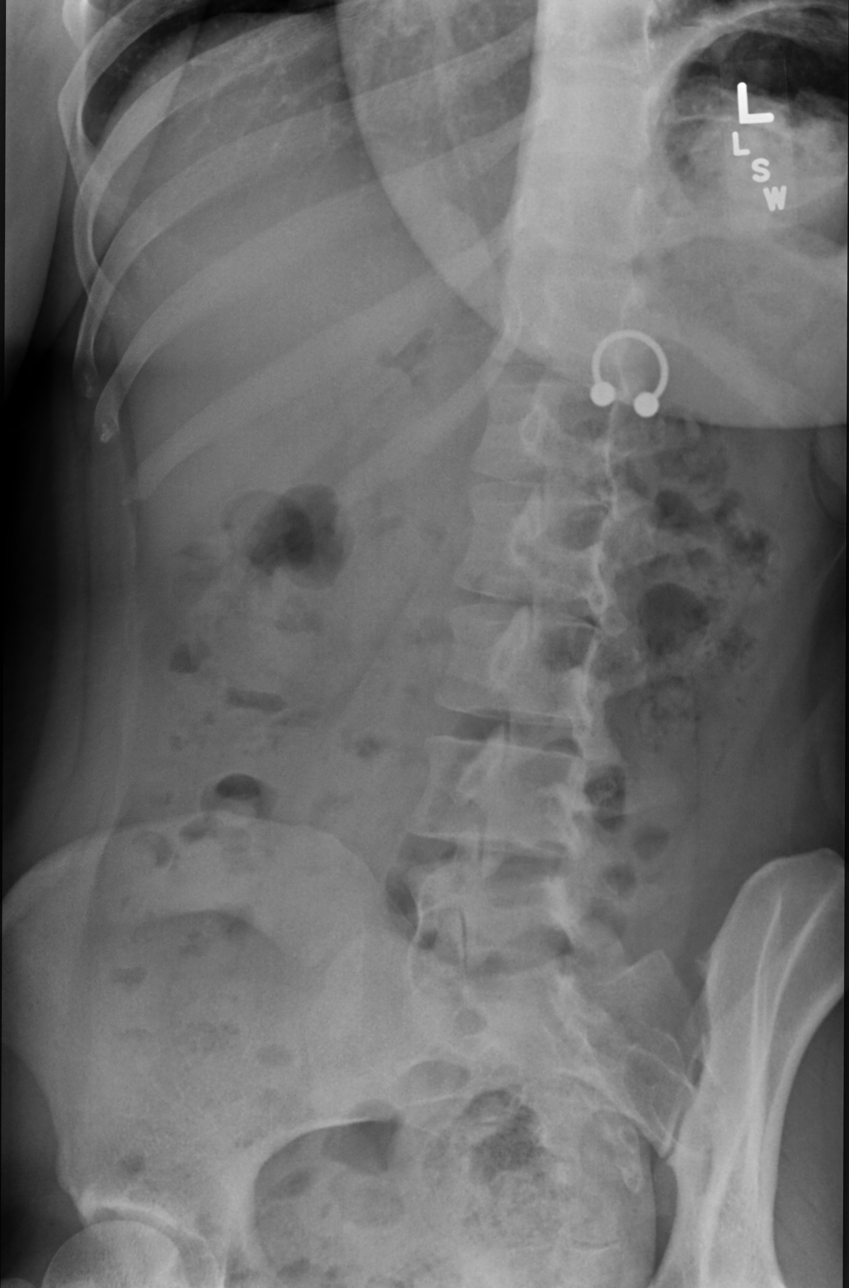

[lumbar spine obl (oblique) (2 of 2)]
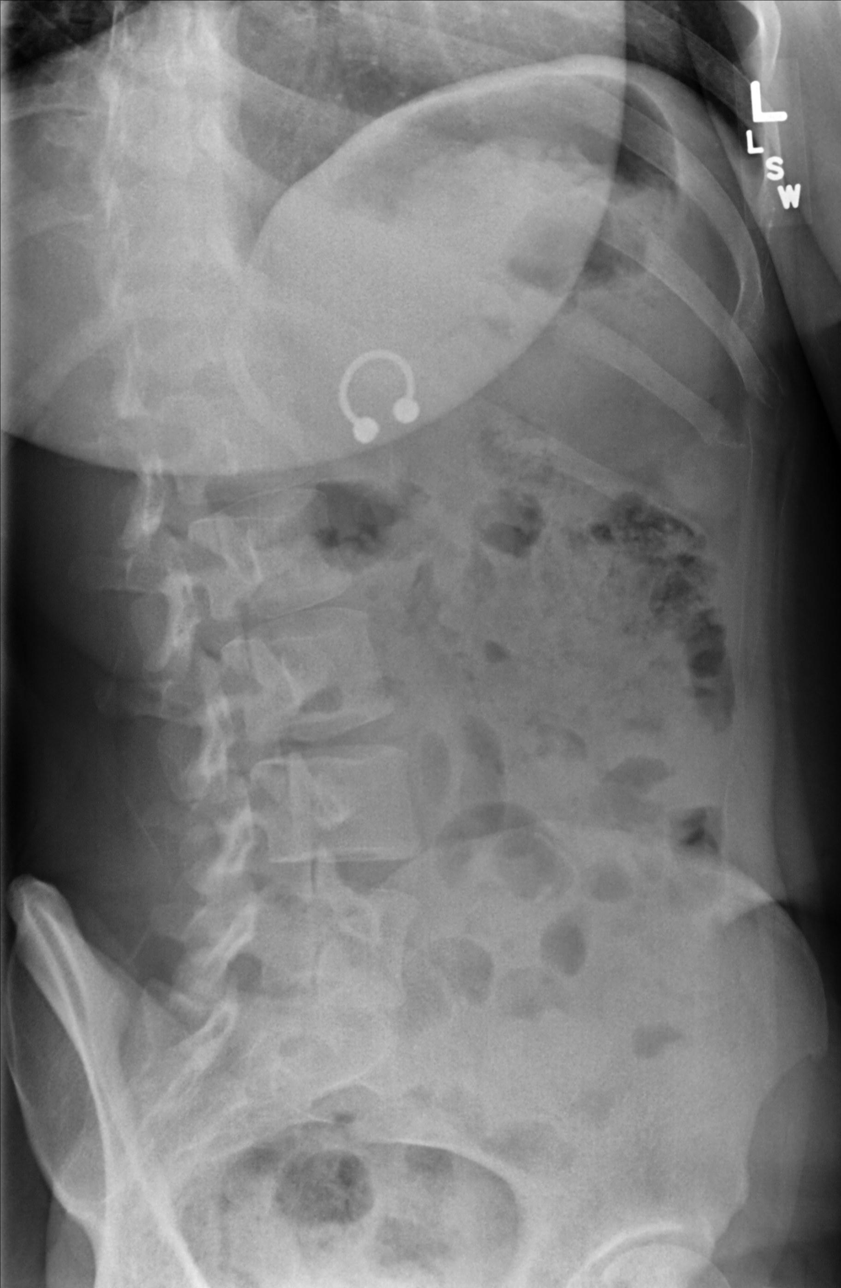

[lumbar spine lat]
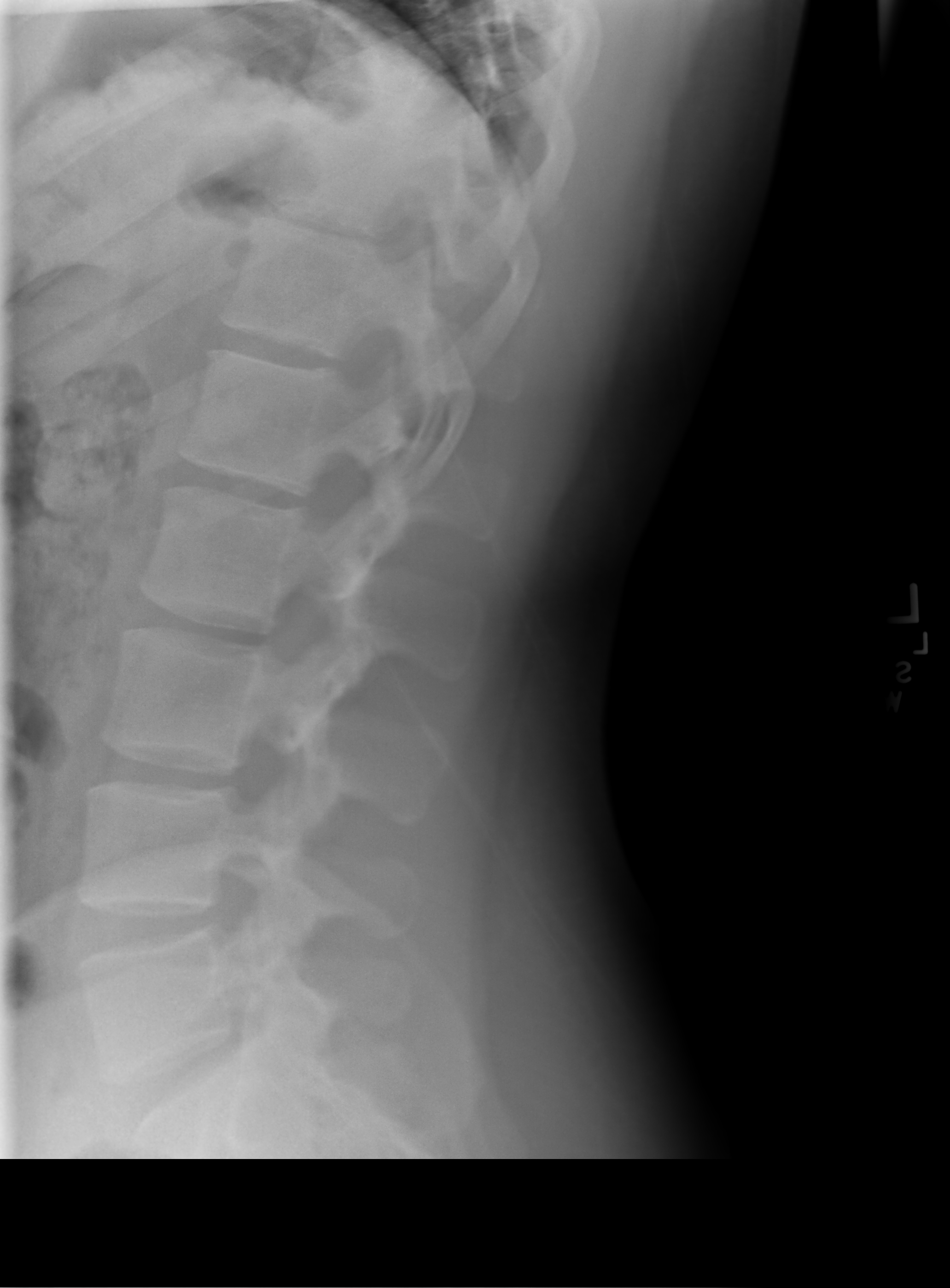

[lumbar spot lat]
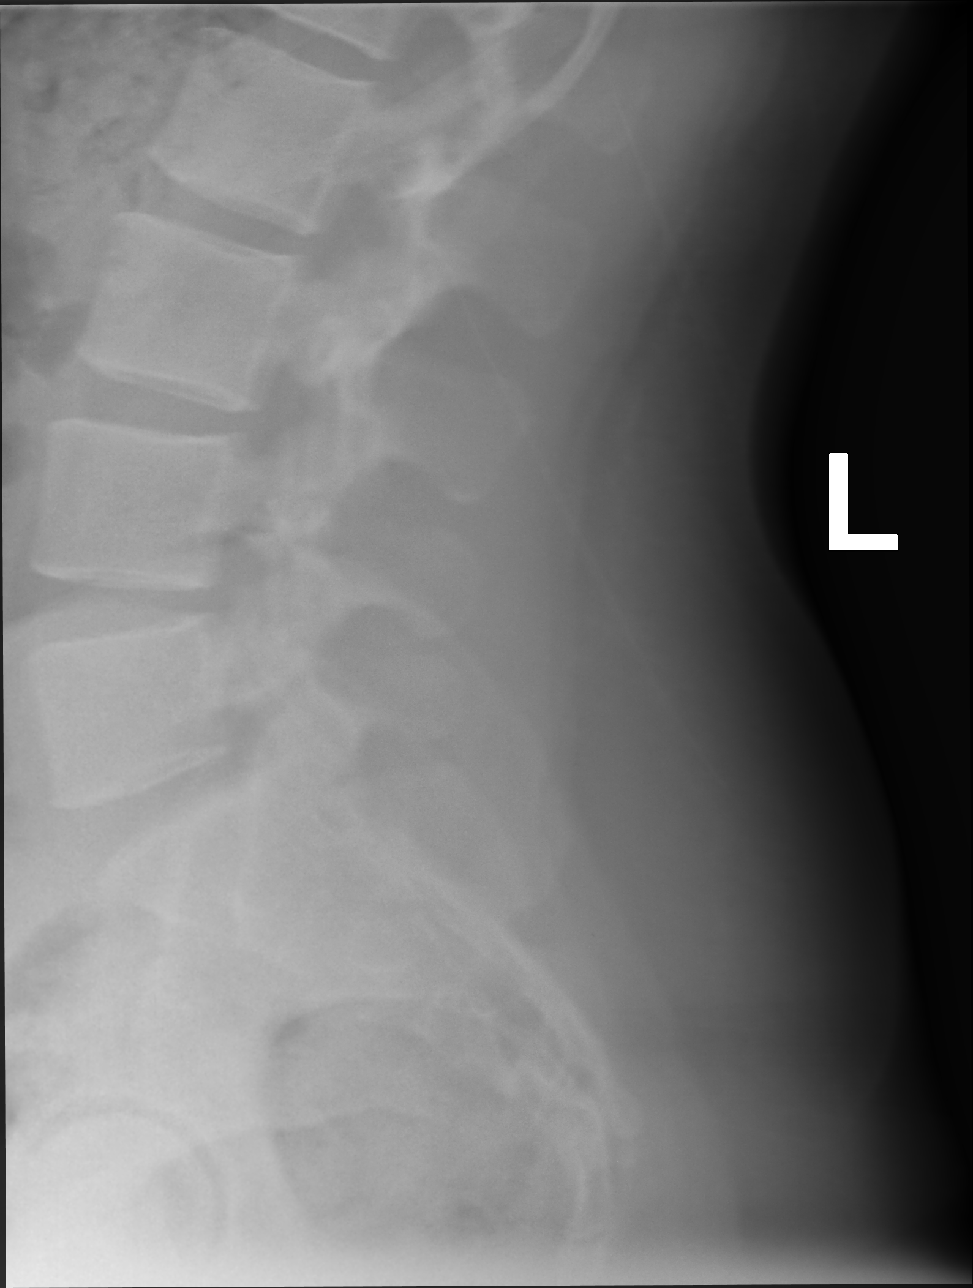

[5 of 5 positions shown; findings below may reference images not displayed]

FINDINGS: There is no evidence of lumbar spine fracture. Alignment is normal.
Intervertebral disc spaces are maintained. No evidence of facet DJD.
No focal bone lesions. Mild thoracolumbar scoliosis noted.
IMPRESSION: Mild thoracolumbar scoliosis.  Otherwise negative.

## 2020-03-03 ENCOUNTER — Other Ambulatory Visit: Payer: Self-pay | Admitting: Internal Medicine

## 2020-03-03 DIAGNOSIS — Z13220 Encounter for screening for lipoid disorders: Secondary | ICD-10-CM

## 2020-03-03 DIAGNOSIS — Z1389 Encounter for screening for other disorder: Secondary | ICD-10-CM

## 2020-03-03 DIAGNOSIS — Z1329 Encounter for screening for other suspected endocrine disorder: Secondary | ICD-10-CM

## 2020-03-03 DIAGNOSIS — E559 Vitamin D deficiency, unspecified: Secondary | ICD-10-CM

## 2020-03-03 DIAGNOSIS — Z0000 Encounter for general adult medical examination without abnormal findings: Secondary | ICD-10-CM | POA: Insufficient documentation

## 2020-03-09 ENCOUNTER — Encounter: Payer: Self-pay | Admitting: Internal Medicine

## 2020-03-09 ENCOUNTER — Ambulatory Visit (INDEPENDENT_AMBULATORY_CARE_PROVIDER_SITE_OTHER): Payer: Self-pay | Admitting: Internal Medicine

## 2020-03-09 ENCOUNTER — Other Ambulatory Visit: Payer: Self-pay

## 2020-03-09 ENCOUNTER — Other Ambulatory Visit (INDEPENDENT_AMBULATORY_CARE_PROVIDER_SITE_OTHER): Payer: Self-pay

## 2020-03-09 VITALS — BP 126/78 | HR 90 | Temp 97.9°F | Ht 64.0 in | Wt 196.8 lb

## 2020-03-09 DIAGNOSIS — Z1389 Encounter for screening for other disorder: Secondary | ICD-10-CM

## 2020-03-09 DIAGNOSIS — E559 Vitamin D deficiency, unspecified: Secondary | ICD-10-CM

## 2020-03-09 DIAGNOSIS — Z0000 Encounter for general adult medical examination without abnormal findings: Secondary | ICD-10-CM

## 2020-03-09 DIAGNOSIS — Z1329 Encounter for screening for other suspected endocrine disorder: Secondary | ICD-10-CM

## 2020-03-09 DIAGNOSIS — L732 Hidradenitis suppurativa: Secondary | ICD-10-CM

## 2020-03-09 DIAGNOSIS — E669 Obesity, unspecified: Secondary | ICD-10-CM | POA: Insufficient documentation

## 2020-03-09 DIAGNOSIS — Z13220 Encounter for screening for lipoid disorders: Secondary | ICD-10-CM

## 2020-03-09 DIAGNOSIS — L739 Follicular disorder, unspecified: Secondary | ICD-10-CM

## 2020-03-09 LAB — COMPREHENSIVE METABOLIC PANEL
ALT: 12 U/L (ref 0–35)
AST: 14 U/L (ref 0–37)
Albumin: 4.5 g/dL (ref 3.5–5.2)
Alkaline Phosphatase: 89 U/L (ref 39–117)
BUN: 10 mg/dL (ref 6–23)
CO2: 28 mEq/L (ref 19–32)
Calcium: 9.5 mg/dL (ref 8.4–10.5)
Chloride: 102 mEq/L (ref 96–112)
Creatinine, Ser: 0.9 mg/dL (ref 0.40–1.20)
GFR: 89.29 mL/min (ref >60.00)
Glucose, Bld: 77 mg/dL (ref 70–99)
Potassium: 3.9 mEq/L (ref 3.5–5.1)
Sodium: 139 mEq/L (ref 135–145)
Total Bilirubin: 0.7 mg/dL (ref 0.2–1.2)
Total Protein: 7.7 g/dL (ref 6.0–8.3)

## 2020-03-09 LAB — T4, FREE: Free T4: 0.94 ng/dL (ref 0.60–1.60)

## 2020-03-09 LAB — TSH: TSH: 3.31 u[IU]/mL (ref 0.35–4.50)

## 2020-03-09 LAB — LIPID PANEL
Cholesterol: 150 mg/dL (ref 0–200)
HDL: 43.3 mg/dL (ref >39.00)
LDL Cholesterol: 90 mg/dL (ref 0–99)
NonHDL: 106.87
Total CHOL/HDL Ratio: 3
Triglycerides: 83 mg/dL (ref 0.0–149.0)
VLDL: 16.6 mg/dL (ref 0.0–40.0)

## 2020-03-09 LAB — CBC WITH DIFFERENTIAL/PLATELET
Basophils Absolute: 0 10*3/uL (ref 0.0–0.1)
Basophils Relative: 0.8 % (ref 0.0–3.0)
Eosinophils Absolute: 0.1 10*3/uL (ref 0.0–0.7)
Eosinophils Relative: 1.7 % (ref 0.0–5.0)
HCT: 40.8 % (ref 36.0–46.0)
Hemoglobin: 13.2 g/dL (ref 12.0–15.0)
Lymphocytes Relative: 47 % — ABNORMAL HIGH (ref 12.0–46.0)
Lymphs Abs: 2.8 10*3/uL (ref 0.7–4.0)
MCHC: 32.4 g/dL (ref 30.0–36.0)
MCV: 83.8 fl (ref 78.0–100.0)
Monocytes Absolute: 0.5 10*3/uL (ref 0.1–1.0)
Monocytes Relative: 7.8 % (ref 3.0–12.0)
Neutro Abs: 2.5 10*3/uL (ref 1.4–7.7)
Neutrophils Relative %: 42.7 % — ABNORMAL LOW (ref 43.0–77.0)
Platelets: 339 10*3/uL (ref 150.0–400.0)
RBC: 4.87 Mil/uL (ref 3.87–5.11)
RDW: 14.2 % (ref 11.5–15.5)
WBC: 5.9 10*3/uL (ref 4.0–10.5)

## 2020-03-09 LAB — VITAMIN D 25 HYDROXY (VIT D DEFICIENCY, FRACTURES): VITD: 14.74 ng/mL — ABNORMAL LOW (ref 30.00–100.00)

## 2020-03-09 MED ORDER — CLINDAMYCIN PHOSPHATE 1 % EX LOTN: TOPICAL_LOTION | Freq: Two times a day (BID) | CUTANEOUS | 11 refills | Status: AC

## 2020-03-09 MED ORDER — DOXYCYCLINE HYCLATE 100 MG PO TABS: 100.0000 mg | ORAL_TABLET | Freq: Two times a day (BID) | ORAL | 0 refills | Status: AC

## 2020-03-09 NOTE — Progress Notes (Signed)
Chief Complaint  Patient presents with  . Follow-up  . Annual Exam   Annual 1. C/o boils/cysts in right inner buttock and b/l thighs thighs rub together at times and she has increased sweating at times  2. Vit d def   Review of Systems  Constitutional: Negative for weight loss.  HENT: Negative for hearing loss.   Eyes: Negative for blurred vision.  Respiratory: Negative for shortness of breath.   Cardiovascular: Negative for chest pain.  Gastrointestinal: Negative for abdominal pain.  Musculoskeletal: Negative for falls and joint pain.  Skin: Negative for rash.  Neurological: Negative for headaches.  Psychiatric/Behavioral: Negative for depression.   Past Medical History:  Diagnosis Date  . Low back pain   . Scoliosis   . Vitamin B12 deficiency   . Vitamin D deficiency    Past Surgical History:  Procedure Laterality Date  . DILATION AND CURETTAGE, DIAGNOSTIC / THERAPEUTIC  04/2016   Family History  Problem Relation Age of Onset  . Alcohol abuse Father   . Depression Father   . Drug abuse Father   . Learning disabilities Father   . Hyperlipidemia Maternal Grandmother   . Hypertension Paternal Grandmother   . Hypertension Paternal Grandfather   . Stroke Paternal Grandfather   . Heart disease Paternal Grandfather   . Diabetes Paternal Grandfather    Social History   Socioeconomic History  . Marital status: Single    Spouse name: Not on file  . Number of children: Not on file  . Years of education: Not on file  . Highest education level: Not on file  Occupational History  . Not on file  Tobacco Use  . Smoking status: Never Smoker  . Smokeless tobacco: Never Used  Substance and Sexual Activity  . Alcohol use: Yes  . Drug use: Not Currently  . Sexual activity: Not on file  Other Topics Concern  . Not on file  Social History Narrative   1 year college    [employer] Member    1 brother    No guns, wears seat belt, safe in relationship     Current lives in [place] married as of 03/08/20 new last name will be Bills husband currently in [employer] training             Social Determinants of Health   Financial Resource Strain: Not on file  Food Insecurity: Not on file  Transportation Needs: Not on file  Physical Activity: Not on file  Stress: Not on file  Social Connections: Not on file  Intimate Partner Violence: Not on file   Current Meds  Medication Sig  . Cholecalciferol 1.25 MG (50000 UT) capsule Take 1 capsule (50,000 Units total) by mouth once a week.  . clotrimazole (LOTRIMIN) 1 % cream Apply 1 application topically 2 (two) times daily.  . hydrocortisone 2.5 % cream Apply topically 2 (two) times daily.   No Known Allergies No results found for this or any previous visit (from the past 2160 hour(s)). Objective  Body mass index is 33.78 kg/m. Wt Readings from Last 3 Encounters:  03/09/20 196 lb 12.8 oz (89.3 kg)  03/06/18 190 lb (86.2 kg)  02/11/18 193 lb 11.2 oz (87.9 kg)   Temp Readings from Last 3 Encounters:  03/09/20 97.9 F (36.6 C) (Oral)  02/11/18 98.9 F (37.2 C) (Oral)  09/11/17 98.4 F (36.9 C) (Oral)   BP Readings from Last 3 Encounters:  03/09/20 126/78  03/06/18 135/82  02/11/18  118/68   Pulse Readings from Last 3 Encounters:  03/09/20 90  03/06/18 69  02/11/18 68    Physical Exam Vitals and nursing note reviewed.  Constitutional:      Appearance: Normal appearance. She is well-developed and well-groomed. She is obese.  HENT:     Head: Normocephalic and atraumatic.  Eyes:     Conjunctiva/sclera: Conjunctivae normal.     Pupils: Pupils are equal, round, and reactive to light.  Cardiovascular:     Rate and Rhythm: Normal rate and regular rhythm.     Heart sounds: Normal heart sounds. No murmur heard.   Pulmonary:     Effort: Pulmonary effort is normal.     Breath sounds: Normal breath sounds.  Abdominal:     General: Abdomen is flat. Bowel sounds are normal.      Tenderness: There is no abdominal tenderness.  Skin:    General: Skin is warm and dry.  Neurological:     General: No focal deficit present.     Mental Status: She is alert and oriented to person, place, and time. Mental status is at baseline.     Gait: Gait normal.  Psychiatric:        Attention and Perception: Attention and perception normal.        Mood and Affect: Mood and affect normal.        Speech: Speech normal.        Behavior: Behavior normal. Behavior is cooperative.        Thought Content: Thought content normal.        Cognition and Memory: Cognition and memory normal.        Judgment: Judgment normal.     Assessment  Plan  Annual physical exam Declines flu shot  Tdap had9/14/10or 05/25/08 covid pfizer had 03/08/20 1/2   Declines STD check  -pap had 04/02/16 negative + yeast; HPV not testedrepeat in 3 years 04/03/2019  Declines 03/08/20 no insurance rec do in future   MMR immune   H/o 2 pregnancies no live births  Eye exam due h/o astigmatism right eye and wears glasses lens [employer]fters  Due to see Dentist Dr. Myrene Buddy HIV neg 04/02/16  Declines to be on OCP or birth control for now as of 02/11/18  Reviewed vaccines  Had HPV vaccines, hep B vaccine, hib vaccine, MMR, 1/2 menigo on 01/13/09, polio vaccines x 4, Td booster 12/13/08 rec healthy diet and exercise   Hidradenitis suppurativa - Plan: doxycycline (VIBRA-TABS) 100 MG tablet, clindamycin (CLEOCIN T) 1 % lotion VS Folliculitis - Plan: doxycycline (VIBRA-TABS) 100 MG tablet, clindamycin (CLEOCIN T) 1 % lotion Consider dermatology in future  Reduced sweating I.e gold bond talc free  Consider dove antibacterial soap   Vitamin D deficiency Pending level   Obesity (BMI 30-39.9)  rec healthy diet and exercise   Provider: Dr. Olivia Mackie McLean-Scocuzza-[facility] Medicine

## 2020-03-09 NOTE — Patient Instructions (Addendum)
Neurosurgeon  Consider dermatology in [place]   Probiotics I.e Align or renew or cuturelle   Consider pap smear in the future   Gold bond talc free powder   Dove antibacterial soap  Armour Hammer Essential Natural deo Rosemary Lavender   Folliculitis  Folliculitis is inflammation of the hair follicles. Folliculitis most commonly occurs on the scalp, thighs, legs, back, and buttocks. However, it can occur anywhere on the body. What are the causes? This condition may be caused by:  A bacterial infection (common).  A fungal infection.  A viral infection.  Contact with certain chemicals, especially oils and tars.  Shaving or waxing.  Greasy ointments or creams applied to the skin. Long-lasting folliculitis and folliculitis that keeps coming back may be caused by bacteria. This bacteria can live anywhere on your skin and is often found in the nostrils. What increases the risk? You are more likely to develop this condition if you have:  A weakened immune system.  Diabetes.  Obesity. What are the signs or symptoms? Symptoms of this condition include:  Redness.  Soreness.  Swelling.  Itching.  Small white or yellow, pus-filled, itchy spots (pustules) that appear over a reddened area. If there is an infection that goes deep into the follicle, these may develop into a boil (furuncle).  A group of closely packed boils (carbuncle). These tend to form in hairy, sweaty areas of the body. How is this diagnosed? This condition is diagnosed with a skin exam. To find what is causing the condition, your health care provider may take a sample of one of the pustules or boils for testing in a lab. How is this treated? This condition may be treated by:  Applying warm compresses to the affected areas.  Taking an antibiotic medicine or applying an antibiotic medicine to the skin.  Applying or bathing with an antiseptic solution.  Taking an over-the-counter medicine to help  with itching.  Having a procedure to drain any pustules or boils. This may be done if a pustule or boil contains a lot of pus or fluid.  Having laser hair removal. This may be done to treat long-lasting folliculitis. Follow these instructions at home: Managing pain and swelling   If directed, apply heat to the affected area as often as told by your health care provider. Use the heat source that your health care provider recommends, such as a moist heat pack or a heating pad. ? Place a towel between your skin and the heat source. ? Leave the heat on for 20-30 minutes. ? Remove the heat if your skin turns bright red. This is especially important if you are unable to feel pain, heat, or cold. You may have a greater risk of getting burned. General instructions  If you were prescribed an antibiotic medicine, take it or apply it as told by your health care provider. Do not stop using the antibiotic even if your condition improves.  Check the irritated area every day for signs of infection. Check for: ? Redness, swelling, or pain. ? Fluid or blood. ? Warmth. ? Pus or a bad smell.  Do not shave irritated skin.  Take over-the-counter and prescription medicines only as told by your health care provider.  Keep all follow-up visits as told by your health care provider. This is important. Get help right away if:  You have more redness, swelling, or pain in the affected area.  Red streaks are spreading from the affected area.  You have a fever. Summary  Folliculitis is inflammation of the hair follicles. Folliculitis most commonly occurs on the scalp, thighs, legs, back, and buttocks.  This condition may be treated by taking an antibiotic medicine or applying an antibiotic medicine to the skin, and applying or bathing with an antiseptic solution.  If you were prescribed an antibiotic medicine, take it or apply it as told by your health care provider. Do not stop using the antibiotic even  if your condition improves.  Get help right away if you have new or worsening symptoms.  Keep all follow-up visits as told by your health care provider. This is important. This information is not intended to replace advice given to you by your health care provider. Make sure you discuss any questions you have with your health care provider. Document Revised: 10/25/2017 Document Reviewed: 10/25/2017 Elsevier Patient Education  2020 Elsevier Inc.   Abdominal Bloating When you have abdominal bloating, your abdomen may feel full, tight, or painful. It may also look bigger than normal or swollen (distended). Common causes of abdominal bloating include:  Swallowing air.  Constipation.  Problems digesting food.  Eating too much.  Irritable bowel syndrome. This is a condition that affects the large intestine.  Lactose intolerance. This is an inability to digest lactose, a natural sugar in dairy products.  Celiac disease. This is a condition that affects the ability to digest gluten, a protein found in some grains.  Gastroparesis. This is a condition that slows down the movement of food in the stomach and small intestine. It is more common in people with diabetes mellitus.  Gastroesophageal reflux disease (GERD). This is a digestive condition that makes stomach acid flow back into the esophagus.  Urinary retention. This means that the body is holding onto urine, and the bladder cannot be emptied all the way. Follow these instructions at home: Eating and drinking  Avoid eating too much.  Try not to swallow air while talking or eating.  Avoid eating while lying down.  Avoid these foods and drinks: ? Foods that cause gas, such as broccoli, cabbage, cauliflower, and baked beans. ? Carbonated drinks. ? Hard candy. ? Chewing gum. Medicines  Take over-the-counter and prescription medicines only as told by your health care provider.  Take probiotic medicines. These medicines contain  live bacteria or yeasts that can help digestion.  Take coated peppermint oil capsules. Activity  Try to exercise regularly. Exercise may help to relieve bloating that is caused by gas and relieve constipation. General instructions  Keep all follow-up visits as told by your health care provider. This is important. Contact a health care provider if:  You have nausea and vomiting.  You have diarrhea.  You have abdominal pain.  You have unusual weight loss or weight gain.  You have severe pain, and medicines do not help. Get help right away if:  You have severe chest pain.  You have trouble breathing.  You have shortness of breath.  You have trouble urinating.  You have darker urine than normal.  You have blood in your stools or have dark, tarry stools. Summary  Abdominal bloating means that the abdomen is swollen.  Common causes of abdominal bloating are swallowing air, constipation, and problems digesting food.  Avoid eating too much and avoid swallowing air.  Avoid foods that cause gas, carbonated drinks, hard candy, and chewing gum. This information is not intended to replace advice given to you by your health care provider. Make sure you discuss any questions you have with your health  care provider. Document Revised: 07/06/2018 Document Reviewed: 04/19/2016 Elsevier Patient Education  2020 Elsevier Inc.   Hidradenitis Suppurativa Hidradenitis suppurativa is a long-term (chronic) skin disease. It is similar to a severe form of acne, but it affects areas of the body where acne would be unusual, especially areas of the body where skin rubs against skin and becomes moist. These include:  Underarms.  Groin.  Genital area.  Buttocks.  Upper thighs.  Breasts. Hidradenitis suppurativa may start out as small lumps or pimples caused by blocked sweat glands or hair follicles. Pimples may develop into deep sores that break open (rupture) and drain pus. Over time,  affected areas of skin may thicken and become scarred. This condition is rare and does not spread from person to person (non-contagious). What are the causes? The exact cause of this condition is not known. It may be related to:  Female and female hormones.  An overactive disease-fighting system (immune system). The immune system may over-react to blocked hair follicles or sweat glands and cause swelling and pus-filled sores. What increases the risk? You are more likely to develop this condition if you:  Are female.  Are 70-44 years old.  Have a family history of hidradenitis suppurativa.  Have a personal history of acne.  Are overweight.  Smoke.  Take the medicine lithium. What are the signs or symptoms? The first symptoms are usually painful bumps in the skin, similar to pimples. The condition may get worse over time (progress), or it may only cause mild symptoms. If the disease progresses, symptoms may include:  Skin bumps getting bigger and growing deeper into the skin.  Bumps rupturing and draining pus.  Itchy, infected skin.  Skin getting thicker and scarred.  Tunnels under the skin (fistulas) where pus drains from a bump.  Pain during daily activities, such as pain during walking if your groin area is affected.  Emotional problems, such as stress or depression. This condition may affect your appearance and your ability or willingness to wear certain clothes or do certain activities. How is this diagnosed? This condition is diagnosed by a health care provider who specializes in skin diseases (dermatologist). You may be diagnosed based on:  Your symptoms and medical history.  A physical exam.  Testing a pus sample for infection.  Blood tests. How is this treated? Your treatment will depend on how severe your symptoms are. The same treatment will not work for everybody with this condition. You may need to try several treatments to find what works best for you.  Treatment may include:  Cleaning and bandaging (dressing) your wounds as needed.  Lifestyle changes, such as new skin care routines.  Taking medicines, such as: ? Antibiotics. ? Acne medicines. ? Medicines to reduce the activity of the immune system. ? A diabetes medicine (metformin). ? Birth control pills, for women. ? Steroids to reduce swelling and pain.  Working with a mental health care provider, if you experience emotional distress due to this condition. If you have severe symptoms that do not get better with medicine, you may need surgery. Surgery may involve:  Using a laser to clear the skin and remove hair follicles.  Opening and draining deep sores.  Removing the areas of skin that are diseased and scarred. Follow these instructions at home: Medicines   Take over-the-counter and prescription medicines only as told by your health care provider.  If you were prescribed an antibiotic medicine, take it as told by your health care provider. Do not  stop taking the antibiotic even if your condition improves. Skin care  If you have open wounds, cover them with a clean dressing as told by your health care provider. Keep wounds clean by washing them gently with soap and water when you bathe.  Do not shave the areas where you get hidradenitis suppurativa.  Do not wear deodorant.  Wear loose-fitting clothes.  Try to avoid getting overheated or sweaty. If you get sweaty or wet, change into clean, dry clothes as soon as you can.  To help relieve pain and itchiness, cover sore areas with a warm, clean washcloth (warm compress) for 5-10 minutes as often as needed.  If told by your health care provider, take a bleach bath twice a week: ? Fill your bathtub halfway with water. ? Pour in  cup of unscented household bleach. ? Soak in the tub for 5-10 minutes. ? Only soak from the neck down. Avoid water on your face and hair. ? Shower to rinse off the bleach from your  skin. General instructions  Learn as much as you can about your disease so that you have an active role in your treatment. Work closely with your health care provider to find treatments that work for you.  If you are overweight, work with your health care provider to lose weight as recommended.  Do not use any products that contain nicotine or tobacco, such as cigarettes and e-cigarettes. If you need help quitting, ask your health care provider.  If you struggle with living with this condition, talk with your health care provider or work with a mental health care provider as recommended.  Keep all follow-up visits as told by your health care provider. This is important. Where to find more information  Hidradenitis Suppurativa Foundation, Inc.: https://www.hs-foundation.org/ Contact a health care provider if you have:  A flare-up of hidradenitis suppurativa.  A fever or chills.  Trouble controlling your symptoms at home.  Trouble doing your daily activities because of your symptoms.  Trouble dealing with emotional problems related to your condition. Summary  Hidradenitis suppurativa is a long-term (chronic) skin disease. It is similar to a severe form of acne, but it affects areas of the body where acne would be unusual.  The first symptoms are usually painful bumps in the skin, similar to pimples. The condition may get worse over time (progress), or it may only cause mild symptoms.  If you have open wounds, cover them with a clean dressing as told by your health care provider. Keep wounds clean by washing them gently with soap and water when you bathe.  Besides skin care, treatment may include medicines, laser treatment, and surgery. This information is not intended to replace advice given to you by your health care provider. Make sure you discuss any questions you have with your health care provider. Document Revised: 03/26/2017 Document Reviewed: 03/26/2017 Elsevier Patient  Education  2020 [employer].

## 2020-03-10 LAB — URINALYSIS, ROUTINE W REFLEX MICROSCOPIC
Bacteria, UA: NONE SEEN /HPF
Bilirubin Urine: NEGATIVE
Glucose, UA: NEGATIVE
Hyaline Cast: NONE SEEN /LPF
Ketones, ur: NEGATIVE
Leukocytes,Ua: NEGATIVE
Nitrite: NEGATIVE
Protein, ur: NEGATIVE
Specific Gravity, Urine: 1.021 (ref 1.001–1.03)
WBC, UA: NONE SEEN /HPF (ref 0–5)
pH: 6 (ref 5.0–8.0)

## 2020-03-13 ENCOUNTER — Other Ambulatory Visit: Payer: Self-pay | Admitting: Internal Medicine

## 2020-03-13 ENCOUNTER — Telehealth: Payer: Self-pay

## 2020-03-13 DIAGNOSIS — E559 Vitamin D deficiency, unspecified: Secondary | ICD-10-CM

## 2020-03-13 DIAGNOSIS — R319 Hematuria, unspecified: Secondary | ICD-10-CM

## 2020-03-13 MED ORDER — CHOLECALCIFEROL 1.25 MG (50000 UT) PO CAPS: 50000.0000 [IU] | ORAL_CAPSULE | ORAL | 1 refills | Status: AC

## 2020-03-13 NOTE — Telephone Encounter (Signed)
Urine culture ordered have pt come and do this if having UTI sx's  Schedule lab visit please

## 2020-03-13 NOTE — Telephone Encounter (Signed)
Patient aware of below and will call back to schedule lab appt

## 2020-03-13 NOTE — Telephone Encounter (Signed)
Patient called back about lab results. Patient states she was not on her cycle at the time sample was given. She has no pain but does have some urniary frequency and urgency x 1 month.

## 2020-03-14 ENCOUNTER — Other Ambulatory Visit (INDEPENDENT_AMBULATORY_CARE_PROVIDER_SITE_OTHER): Payer: Self-pay

## 2020-03-14 ENCOUNTER — Other Ambulatory Visit: Payer: Self-pay

## 2020-03-14 DIAGNOSIS — R319 Hematuria, unspecified: Secondary | ICD-10-CM

## 2020-03-14 NOTE — Telephone Encounter (Signed)
Patient scheduled lab appt for today at 11:30 am

## 2020-03-14 NOTE — Addendum Note (Signed)
Addended by: Bonnell Public I on: 03/14/2020 11:15 AM   Modules accepted: Orders

## 2020-03-15 LAB — URINE CULTURE
MICRO NUMBER:: 11315194
SPECIMEN QUALITY:: ADEQUATE

## 2020-08-05 ENCOUNTER — Other Ambulatory Visit: Payer: Self-pay | Admitting: Internal Medicine

## 2020-08-05 DIAGNOSIS — E559 Vitamin D deficiency, unspecified: Secondary | ICD-10-CM
